# Patient Record
Sex: Male | Born: 2005 | Race: White | Hispanic: No | Marital: Single | Smoking: Never smoker
Health system: Southern US, Community
[De-identification: ages and names within clinical notes are randomized; demographics above are authoritative.]

## PROBLEM LIST (undated history)

## (undated) DIAGNOSIS — T7840XA Allergy, unspecified, initial encounter: Secondary | ICD-10-CM

## (undated) HISTORY — PX: CIRCUMCISION: SUR203

## (undated) HISTORY — DX: Allergy, unspecified, initial encounter: T78.40XA

---
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
## 2024-08-13 NOTE — Progress Notes (Signed)
 "    Shoshanah Dapper T. Jersee Winiarski, MD, CAQ Sports Medicine [facility] at [facility] [address] [place], [address]  Phone: [phone]  FAX: [phone]  NICKOLES GREGORI - 18 y.o. male  MRN [id number]  Date of Birth: Apr 29, 2006  Date: 08/14/2024  PCP: Watt Mirza, MD  Referral: Barbra Cough, DO  Chief Complaint  Patient presents with   Knee Pain    Right   Fall    Running around Christmas time   Subjective:   Nuri A Shill is a 18 y.o. very pleasant male patient with Body mass index is 21.36 kg/m. who presents with the following:  Discussed the use of AI scribe software for clinical note transcription with the patient, who gave verbal consent to proceed.  Patient presents for sports medicine consultation.   History of Present Illness Kailan A Coufal is a 18 year old male who presents with chronic right knee pain exacerbated by recent falls.  He has been experiencing chronic right knee pain for the past couple of years, which has intensified following two falls on hard surfaces around Christmas time. 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  There is no significant swelling or discoloration noted following the falls, although there may have been some initial swelling. He manages the pain by stretching and applying pressure to the knee, though the effectiveness of these measures is uncertain. He exercises once or twice a week, focusing on upper body workouts, and does not regularly engage in leg exercises. He denies any history of fractures or surgeries in the knee.  During the review of symptoms, he notes that the pain is not constant and that it hurts when pressure is applied to a specific area on the inside of the knee.    Review of Systems is noted in the HPI, as appropriate  Objective:    BP 90/60   Pulse 69   Temp 97.9 F (36.6 C) (Temporal)   Ht 5' 5 (1.651 m)   Wt 128 lb 6 oz (58.2 kg)   SpO2 98%   BMI 21.36 kg/m   GEN: No acute distress; alert,appropriate. PULM: Breathing comfortably in no respiratory distress PSYCH: Normally interactive.    Knee: Right Gait: Normal heel toe pattern ROM: 0-1 35 Effusion: neg Echymosis or edema: none Patellar tendon NT Painful PLICA: Mild plica medial and laterally, mildly tender to palpation Patellar grind: negative Medial and lateral patellar facet loading: Mildly tender medial and lateral joint lines:NT Mcmurray's neg Flexion-pinch neg Varus and valgus stress: stable Lachman: neg Ant and Post drawer: neg Hip abduction, IR, ER: WNL Hip flexion str: 5/5 Hip abd: 5/5 Quad: 5/5 VMO atrophy:No Hamstring concentric and eccentric: 5/5   Physical Exam MUSCULOSKELETAL: Right knee with normal extension and range of motion, no swelling. Ligaments stable, including ACL. Non-tender on palpation.  Laboratory and Imaging Data:  Assessment and Plan:     ICD-10-CM   1. 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 Chronic pain of right knee  M25.561 DG Knee 4 Views W/Patella Right   G89.29     3. Inattention  R41.840 Ambulatory referral to Psychology    4. Patellofemoral syndrome, right  M22.2X1      Radiographically, on the sunrise view the patient does have a lateral tilt of the patella but  there is no evidence of fracture and no significant degenerative changes. 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 X-ray confirmed maltracking with no major bone abnormalities.  - Recommend ibuprofen  or Aleve for pain management. - Advise ice massage to reduce irritation. - Encourage leg exercises, such as squats with dumbbells, to strengthen the knee. - Provide printout of leg exercises.  Possible attention-deficit disorder (ADD/ADHD) Attention difficulties noted,  particularly in school settings. Challenges with task completion and focus reported. Interest in ADD evaluation expressed. - Recommend pursuing an ADD evaluation for further assessment.    Orders placed today for conditions managed today: Orders Placed This Encounter  Procedures   DG Knee 4 Views W/Patella Right   Ambulatory referral to Psychology    Disposition: No follow-ups on file.  Dragon [employer] One speech-to-text software was used for transcription in this dictation.  Possible transcriptional errors can occur using Animal nutritionist.   Signed,  Jacques DASEN. Kyliee Ortego, MD   Outpatient Encounter Medications as of 08/14/2024  Medication Sig   cephALEXin  (KEFLEX ) 500 MG capsule Take 1 capsule (500 mg total) by mouth 3 (three) times daily.   [DISCONTINUED] cetirizine  (ZYRTEC ) 10 MG tablet Take one tablet at night for nasal congestion   [DISCONTINUED] clindamycin -benzoyl peroxide (BENZACLIN) gel DISPENSE GENERIC FOR INSURANCE. APPLY TO ACNE TWICE A DAY AFTER WASHING FACE WITH ACNE SOAP   [DISCONTINUED] diazepam (DIASTAT ACUDIAL) 10 MG GEL Place 7.5 mg rectally.   [DISCONTINUED] ketoconazole  (NIZORAL ) 2 % cream Apply equal amount of cream with triamcinolone  cream twice day for up to one week as needed   [DISCONTINUED] mupirocin  ointment (BACTROBAN ) 2 % Apply 1 application topically 2 (two) times daily.   [DISCONTINUED] polyethylene glycol powder (GLYCOLAX /MIRALAX ) powder Take 17 g by mouth 2 (two) times daily as needed for mild constipation or moderate constipation.   [DISCONTINUED] triamcinolone  cream (KENALOG ) 0.1 % Apply equal amount of cream with ketoconazole  cream twice day for up to one week as needed   No facility-administered encounter medications on file as of 08/14/2024.   "

## 2024-08-14 ENCOUNTER — Ambulatory Visit (INDEPENDENT_AMBULATORY_CARE_PROVIDER_SITE_OTHER): Admitting: Family Medicine

## 2024-08-14 ENCOUNTER — Ambulatory Visit (INDEPENDENT_AMBULATORY_CARE_PROVIDER_SITE_OTHER)
Admission: RE | Admit: 2024-08-14 | Discharge: 2024-08-14 | Disposition: A | Discharge status: Home / Self Care | Source: Ambulatory Visit | Attending: Family Medicine | Admitting: Family Medicine

## 2024-08-14 VITALS — BP 90/60 | HR 69 | Temp 97.9°F | Ht 65.0 in | Wt 128.4 lb

## 2024-08-14 DIAGNOSIS — M6751 Plica syndrome, right knee: Secondary | ICD-10-CM

## 2024-08-14 DIAGNOSIS — M222X1 Patellofemoral disorders, right knee: Secondary | ICD-10-CM | POA: Diagnosis not present

## 2024-08-14 DIAGNOSIS — R41840 Attention and concentration deficit: Secondary | ICD-10-CM

## 2024-08-14 DIAGNOSIS — G8929 Other chronic pain: Secondary | ICD-10-CM | POA: Diagnosis not present

## 2024-08-14 DIAGNOSIS — M25561 Pain in right knee: Secondary | ICD-10-CM

## 2024-08-23 ENCOUNTER — Ambulatory Visit: Payer: Self-pay | Admitting: Family Medicine

## 2024-08-30 ENCOUNTER — Encounter: Payer: Self-pay | Admitting: *Deleted

## 2024-10-07 ENCOUNTER — Ambulatory Visit: Admitting: Psychologist

## 2024-10-07 DIAGNOSIS — F909 Attention-deficit hyperactivity disorder, unspecified type: Secondary | ICD-10-CM

## 2024-10-07 NOTE — Progress Notes (Unsigned)
 Psychology Visit via Telemedicine  10/07/2024 Roy Simmons [id number]  Session Start time: 10:00  Session End time: 11:00 Total time: 60 minutes on this telehealth visit inclusive of face-to-face video and care coordination time.  Referring Provider: PCP, Dr. Ubaldo Type of Visit: Video Patient location: Home Provider location: Practice Office All persons participating in visit: Roy Simmons  Confirmed patient's address: Yes  Confirmed patient's phone number: Yes  Any changes to demographics: No   Confirmed patient's insurance: Yes  Any changes to patient's insurance: No   Discussed confidentiality: Yes    The following statements were read to the patient.  The purpose of this telehealth visit is to provide psychological services remotely and you understand the limitations of a virtual visit rather than an in person visit. If technology fails and video visit is discontinued, you will receive a phone call on the phone number confirmed in the chart above. Do you have any other options for contact No   By engaging in this telehealth visit, you consent to the provision of healthcare.  Additionally, you authorize for your insurance to be billed for the services provided during this telehealth visit.   Patient consented to telehealth visit: Yes    Roy Simmons was seen in consultation by request of PCP for evaluation and management of ADHD.     Roy Simmons likes to be called Roy Simmons.   Gender assigned at birth: male Gender identity: male Preferred pronouns: he/him  Provider/Observer:  Roy Simmons. Roy Simmons, Roy Simmons  Reason for Service:  Testing for ADHD  Consent/Confidentiality discussed with patient:Yes Clarified the medical team at [facility], including BH coordinators and other staff members at [facility] involved in their care will have access to their visit note information unless it is marked as specifically sensitive: Yes  Reviewed with patient what will be discussed with parent/caregiver/guardian &  patient gave permission to share that information: Yes Reviewed with patient what information is able to be seen in EMR (Epic) and by who: Yes   Behavioral Observation: Roy Simmons  presents as a 18 y.o.-year-old Male who appeared his stated age. his manners were Appropriate to the situation.  There were not any physical disabilities noted.  he displayed an appropriate level of cooperation and motivation.    Mental status exam        Orientation: oriented to time, place and person, appropriate for age        Speech/language:  speech development normal for age, level of language normal for age        Attention:  attention span and concentration appropriate for age        Naming/repeating:  names objects, follows commands, conveys thoughts and feelings  Sources of information include previous medical records, school records, and direct interview with patient and/or parent/caregiver during today's appointment with this provider.   Notes on Problem: Reports difficulty focusing on tasks and paying attention, even during conversation. Feels he's noticed this only 1-2 years ago. Can get behind on things - out of sight/out of mind (I.e. changing oil). Feels he's never thought about it before, thought it was his normal. His mother started talking to him about it and saying that she has ADHD and others in the family so he started thinking about it. Feels he's anxious b/c he's always in his Roy Simmons thinking about what he's going to say or do next or check off a list. Feels some concern with mood b/c he feels overstimulated - feels drained, doesn't feel good. Checked of dissociation  b/c he checks out when overwhelmed. Feels this occurs every couple of days. Has noticed anxiety since a young child and the mood more recently.   Strategies Attempted at home Has dealt with in on his own for anxiety. Has spoken to other doctors about mood symptoms in the past. Has never taken medication or therapy for these  conditions.   Current Language Ability/Level: fluent  Any functional impairments in adaptive behaviors?  No  Trauma History No  Risk Assessment: Danger to Self:  Yes - some passive S/I once every couple of months and just pushes the thought away - feels its not an option, would never do this. Feels this makes him feel more emotional. When feeling overwhelmed for too long, these thoughts come on. Has thought about therapy but has not initiated in it - not sure about therapy.  Self-injurious Behavior: No Danger to Others: No Duty to Warn:no Physical Aggression / Violence:No  Access to Firearms a concern: Yes  - Locked away Gang Involvement:No  Patient / guardian was educated about steps to take if suicide or homicide risk level increases between visits: yes While future psychiatric events cannot be accurately predicted, the patient does not currently require acute inpatient psychiatric care and does not currently meet [place]  involuntary commitment criteria.  Medical History: Roy Simmons was the product of an uncomplicated pregnancy, labor, and delivery based on his knowledge. 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 There have been some joint problems and an ankle sprain or fracture. No other medically related events reported including hospitalizations, chronic medical conditions, seizures, Roy Simmons injury, or loss of consciousness. There is no history of cardiac concerns, stomach aches or vocal/motor tics. Had frequent headaches previously. No concerns with hearing or vision. Current medications include none. . Routine medical care is provided by Roy Mirza, MD.   Family History: Roy Simmons with his mother, step father, and two cousins (32 and young adult). Paternal half- sister (60 y/o) Simmons with father. Family relationships are good. Mother is the primary caregiver and is a Roy Simmons. Family  history is positive for ADHD (mother and cousins) and depression (paternal aunt). Father wasn't consistently in his life and when he's around he can be very anxious and awkward, described as in his own world. Biological parents were never married and father was absent since he was a younger child with inconsistent contact every week or two when he had time. Horris grew up living mom and older brother.   Social/Developmental History Robley tends to go to bed late (around 2-5 am - often gaming, on phone, or watching TV) and sleeps in late when he can. Sleeps through the night and gets up around 11am - 1pm. Has trouble falling asleep - mind races at bedtime. In highschool he would go to bed really late - often getting 6 hours of sleep during the week. Feels he's always had trouble falling asleep - never wanting to sleep. Maurio feels he eats pretty well but eats more later into the night. Used to exercise more regularly (weight lifting) but hasn't done this in a while but wants to get back to it.   Salah works part time at [employer] and its going well. Graduated [employer] - graduated 2024. School could be hard b/c he would procrastinate so much. Mother assisted with helping him stay on track. Grades were As and Bs mostly - passed all classes. Got  in trouble a lot for talking and not paying attention in elementary and middle school.   Has a girlfriend that he spends a lot of time with and goes places with. Likes to spend time with mom and used to spend more time with dad's side of the family. Spends time with dog and plays video games. Currently attending [employer] - wants to be a a designer, jewellery. Not enrolled in nursing program yet - completing pre-requisite courses now. Feels he has more motivation now b/c the classes cost money but it can still be a struggle - missed a test recently. Only taking one class now but planning on taking 3 next semester. Has always  done well with reading and needs to apply self more for writing papers. Math has been more of a challenge in the past - had trouble retaining information. Would forget formulas often.     Disposition/Plan:   - Psychological testing for ADHD, anxiety, mood - Alyus is in agreement for mother to complete ratings as needed - Feedback Scheduled - Testing plan discussed with parent who expressed understanding.   Impression/Diagnosis:     Unspecified ADHD   Roy Simmons. Rashena Dowling, SSP, [facility] Licensed Psychological Associate [phone] Psychologist [facility] at [facility]   [phone]  Office [phone]  Fax

## 2024-10-14 ENCOUNTER — Ambulatory Visit: Admitting: Psychologist

## 2024-10-14 DIAGNOSIS — F909 Attention-deficit hyperactivity disorder, unspecified type: Secondary | ICD-10-CM | POA: Diagnosis not present

## 2024-10-14 NOTE — Progress Notes (Addendum)
  Roy Simmons  [id number]  Medicaid Identification Number [id number]   10/14/24  Psychological testing Face to face time start: 9:00  End:11:47  Any medications taken as prescribed for today's visit N/A Any atypicalities with sleep last night no Any recent unusual occurrences no  Purpose of Psychological testing is to help finalize unspecified diagnosis  Today's appointment is one of a series of appointments for psychological testing. Results of psychological testing will be documented as part of the note on the final appointment of the series (results review).  Patient was accompanied today by self  Tests completed during previous appointments: Intake  Individual tests administered: SB-V CNS Vital Signs Adult ADHD Self-Report Scale (ASRS-v1.1)    Depression, Anxiety and Stress Scale (DASS)  Generalized Anxiety Disorder 7-item Scale (GAD-7)    This date included time spent performing: performing the authorized Psychological Testing = 3 hours scoring the Psychological Testing by psychologist= 1 hour  Pre-authorized  [facility] Medicaid approval:   The 8hrs are to be applied as follows but not to exceed a total of units/ 8hrs: o 96130/96132-1 unit, 96131/96133-3 units (not to exceed a total of 4 units) o 96136-1 unit and/or 96138-1 unit (not to exceed 2 units) o 96137-1-7 units and/or 03860- 1-7 units (not to exceed 7 units)  Amount of time to be billed on this date of service for psychological testing: 96130 (0 units) - 1 remaining 96131 (0 units) - 3 remaining 96136 (1 units) - 0 remaining 96137 (7 units) - 0 remaining  Previously Utilized: None  Total amount of time billed for psychological testing: 96130 (0 units) - 1 remaining 96131 (0 units) - 3 remaining 96136 (1 units) - 0 remaining 96137 (7 units) - 0 remaining  Plan/Assessments Needed: Semi Structured Clinical Interview (CNS) DSM-V Cross cutting measure  Interview Follow-up: - Psychological  testing for ADHD, anxiety, mood - Roy Simmons is in agreement for mother to complete ratings as needed - Feedback Scheduled - Testing plan discussed with parent who expressed understanding.  - Kamil left with ADHD Adult Rating Scale - Parent/Caregiver for mother to complete - BREIF-2A emailed 10/14/24 - ROI for PCP signed, hard copy in file and on S drive  Next Appointment:  In Person  Diagnosis: Unspecified ADHD  Impression: Unclear. Ratings and Interview will be crucial. Well focused today and hard worker. Started to struggle around the 2 hour mark, even after a break. Completely missed one WM item b/c he reported to completely zone out.  Heron RAMAN. Yexalen Deike, SSP [place] Licensed Psychological Associate [phone] Psychologist [facility] at [facility]   [phone]  Office [phone]  Fax

## 2024-10-17 ENCOUNTER — Ambulatory Visit: Admitting: Psychologist

## 2024-10-17 ENCOUNTER — Telehealth: Payer: Self-pay

## 2024-10-17 DIAGNOSIS — F909 Attention-deficit hyperactivity disorder, unspecified type: Secondary | ICD-10-CM

## 2024-10-17 NOTE — Telephone Encounter (Signed)
 Yes, I can prescribe ADHD medication.  I would like to get a report from the counselor before I start medication.

## 2024-10-17 NOTE — Progress Notes (Unsigned)
 Roy Simmons  [id number]  Medicaid Identification Number  [id number]     10/17/24  Developmental testing*** Psych Interview Face to face time start: 9:00  End:12:00 Total time: 180 minutes on this telehealth visit inclusive of face-to-face video and care coordination time.  Purpose of Developmental testing is to help finalize unspecified diagnosis  Individual tests administered: Semi-structured Clinical Interview based on BREIF-2  Depression: The DSM-5 outlines the following criterion to make a diagnosis of depression. The individual must be experiencing five or more symptoms during the same 2-week period and at least one of the symptoms should be either (1) depressed mood or (2) loss of interest or pleasure.   No - Depressed mood most of the day, nearly every day. - Base mood is mellow/happy - feels he's here enjoying life.    No - Markedly diminished interest or pleasure in all, or almost all, activities most of the day, nearly every day.  Loses interest in certain games (Fortnite, [place], [place]) after a few days and gets bored with it but continues to play some. This seems to occur if he can't get really good at it and if he isn't he gets bored of it. This has occurred with skateboarding, basketball with friends, working out, and cooking. Still enjoys doing them at times but starts to feel like its pointless but this can be cyclical. However, gets in moments where he feels everything is just meaningless - occurs randomly maybe 2-3 times a month for the day. He then redirects to things he has to do like schoolwork and it gets him going. Lately has enjoyed a new [place] place with girlfriend, watching a new anime, spending time with girlfriend when they are together (watch shows, go out places), talking with mom. Looks forward to smaller moments like getting off work and relaxing/playing video games, finishing a task to get it off his list, eating, and watching shows. Played  basketball with friends for hours a couple weeks ago. Friends he's had since elementary school.   Lately feels overwhelmed like he has so much to do, has a list (exams, air in tires, register for classes, haircut, schoolwork due, appointments, errands). This sense of overwhelm has gotten worse as he's trying to stay on top of it now where before he would procrastinate or just not get things done. Mostly schoolwork has always been okay/passing although sometimes would not turn things in or just decide not to do it. Chores wouldn't get done, would not get things taken care of with his car.  Loves anime and never gets bored of it.    No - Significant weight loss when not dieting or weight gain, or decrease or increase in appetite nearly every day.    No - Psychomotor agitation or retardation nearly every day (observable by others, not merely subjective feelings of restlessness or being slowed down).   Yes - Fatigue or loss of energy nearly every day. - Feels exhausted easily with regular tasks like doing a few dishes. Thinks about consequences to get through work. This is with mundane/boring tasks.    No - Feelings of worthlessness or excessive or inappropriate guilt nearly every day. Feels like he can be a little too much (the way he acts and the things he does) and feels bad for his girlfriend.   No - Diminished ability to think or concentrate, or indecisiveness, nearly every day. Feels a little less focused since graduating high school with more things he has to do and  balancee.    No - Recurrent thoughts of death, recurrent suicidal ideation without a specific plan, or a suicide attempt or a specific plan for committing suicide.  Some passive S/I once every couple of months and just pushes the thought away - feels its not an option, would never do this. Feels this makes him feel more emotional. When feeling overwhelmed for too long, these thoughts come on. Has thought about therapy but has not  initiated in it - not sure about therapy.   Anxiety Feels more irritable more often recently - mood more unstable. Gets annoyed quickly with things his girlfriend may do that before he would find funny or quickly irritated when she makes a mistake like leaving a wrapper out, or if mom isn't actually listening to him. Then is irritable after that.   Feels anxious about things he has to do. Gets nervous about how others will react to him, nervous about being embarrassed.   Social anxiety: According to the DSM-5, (Diagnostic and Statistical Manual of Mental Disorders, fifth edition), there are a total of ten diagnostic criteria for Social Anxiety disorder :  Yes - fear or anxiety specific to social settings, in which a person feels noticed, observed, or scrutinized. In a adult, this could include a first date, a job interview, meeting someone for the first time, delivering an oral presentation, or speaking in a class or meeting. In children, the phobic/avoidant behaviors must occur in settings with peers, rather than adult interactions, and will be expressed in terms of age appropriate distress, such as cringing, crying, or otherwise displaying obvious fear or discomfort. No - typically the individual will fear that they will display their anxiety and experience social rejection, - has physical symtpoms of sweating or shaking leg or feeling cold/shaky Yes - social interaction will consistently provoke distress, people and friends - doesn't always want to hang out b/c doesn't know what's going to happen Yes - social interactions are either avoided, or painfully and reluctantly endured, SUDS 5 somewhat - the fear and anxiety will be grossly disproportionate to the actual situation, yes - the fear, anxiety or other distress around social situations will persist for six months or longer and No - cause personal distress and impairment of functioning in one or more domains, such as interpersonal or  occupational functioning, - Doesn't get in the way a lot, still does what he needs/wants to do  - the fear or anxiety cannot be attributed to a medical disorder, substance use, or adverse medication effects or another mental disorder, and  - if another medical condition is present which may cause the individual to be excessively self conscious- e.g., prominent facial scar, the fear and anxiety are either unrelated, or disproportionate. The clinician may also include the specifier that the social anxiety is performance situation specific - e.g., oral presentations (American Psychiatric Association, 2013).    People with ADHD show a persistent pattern of inattention and/or hyperactivity-impulsivity that interferes with functioning or development:  CNS Observation report: felt the need to get up and move (stretched, got up and blew nose, or rocked in seat - needed to do this to reset brain release the tension - difficulty focusing and discomfort with sitting still no stress or anxiety). Felt lost attention during CPT and color task with the easier items especially. Was able to focus better as it got a little more complex.   Inattention: Six or more symptoms of inattention for children up to age 83 years, or five  or more for adolescents age 46 years and older and adults; symptoms of inattention have been present for at least 6 months, and they are inappropriate for developmental level:  In conversation today, lost his train of thought and asked questions to be repeated several times. Feels a disconnect between his thoughts and what he's saying. Thoughts bounce to different things he's thought/said and then loses track.   No - Often fails to give close attention to details or makes careless mistakes in schoolwork, at work, or with other activities. yes - Often has trouble holding attention on tasks or play activities. - depends on how interested he is yes - Often does not seem to listen when spoken to  directly. yes - Often does not follow through on instructions and fails to finish schoolwork, chores, or duties in the workplace (e.g., loses focus, side-tracked). Yes - Often has trouble organizing tasks and activities. Throws a bunch of stuff into drawers but doesn't have things organized necessarily but usually knows where things are even though its a mess.  yes - Often avoids, dislikes, or is reluctant to do tasks that require mental effort over a long period of time (such as schoolwork or homework). Is good about finishing when already started but has a hard time getting started on a project. Does hard/interesting parts first and then puts off the easier parts.  yes - Often loses things necessary for tasks and activities (e.g. school materials, pencils, books, tools, wallets, keys, paperwork, eyeglasses, mobile telephones).Misplaces things often but has improved b/c doesn't want to have to pay for it.  yes - Is often easily distracted Yes - Is often forgetful in daily activities. - tends to forget appointments if there isn't a visual reminder. Does not have a calendar. Only has a to do to list.   Hyperactivity and Impulsivity: Six or more symptoms of hyperactivity-impulsivity for children up to age 6 years, or five or more for adolescents age 49 years and older and adults; symptoms of hyperactivity-impulsivity have been present for at least 6 months to an extent that is disruptive and inappropriate for the person's developmental level: yes - Often fidgets with or taps hands or feet, or squirms in seat. no - Often leaves seat in situations when remaining seated is expected. yes - Often runs about or climbs in situations where it is not appropriate (adolescents or adults may be limited to feeling restless). no - Often unable to play or take part in leisure activities quietly. yes - Is often "on the go" acting as if "driven by a motor". - can be still for hours with video games but otherwise likes to  be on the go b/c doesn't like to be bored yes - Often talks excessively. yes - Often blurts out an answer before a question has been completed. no - Often has trouble waiting their turn. yes - Often interrupts or intrudes on others (e.g., butts into conversations or games)   These symptoms make him feel like he has tension in his brain - overwhelm. Takes a lot to organize his thoughts. Hard for him to think about what he wants/needs to focus on leading to a feeling of always being on-edge and a feeling like he's going to explode. It interferes at school with harder projects, gets overwhelmed and takes longer to get things done. Lots of trouble focusing/overwhelmed. Causes problems with girlfriend b/c he can be irritable. Biggest issue for his girlfriend is his difficulty not being able to pay attention to  what she's saying and he may just start scrolling even though he knows he shouldn't do that. Does this with friends as well.   This date included time spent performing: clinical interview = 2 hours performing and scoring Developmental Testing = 1 hour scoring Developmental Testing by psychologist= *** integration of patient data = *** interpretation of standard test results and clinical data = *** clinical decision making = *** Documentation of developmental testing = ***  Total amount of time to be billed on this date of service for developmental testing  03887 (1 unit)  = 1 hour 96113 (*** units) = *** hours  Interview Follow-up: - Psychological testing for ADHD, anxiety, mood - Keval is in agreement for mother to complete ratings as needed - Feedback Scheduled - Testing plan discussed with parent who expressed understanding.  - Brady left with ADHD Adult Rating Scale - Parent/Caregiver for mother to complete. Completed and returned - BREIF-2A emailed 10/14/24. Completed. - ROI for PCP signed, hard copy in file and on S drive  Next Appointment: virtual  Diagnosis: Unspecified  ADHD  Impression: Unclear. Ratings and Interview will be crucial. Well focused today and hard worker. Started to struggle around the 2 hour mark, even after a break. Completely missed one WM item b/c he reported to completely zone out.  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 Elizabth Palka, SSP [place] Licensed Psychological Associate [phone] Psychologist [facility] at [place]   [phone]  Office [phone]  Fax

## 2024-10-17 NOTE — Telephone Encounter (Signed)
 Copied from CRM [id number]. Topic: Clinical - Medication Question >> Oct 17, 2024 12:26 PM Deaijah H wrote: Reason for CRM: Patient would like to leave a message for Dr. Watt regarding getting prescribed medication. Would like to know if he would be comfortable or would prescribed ADHD medicine. His psychiatrist would like to know if Dr. Watt would need a full report or summary if he does prescribe.    Dr. Watt please advise.

## 2024-12-15 ENCOUNTER — Encounter: Payer: Self-pay | Admitting: Family Medicine

## 2024-12-29 NOTE — Progress Notes (Unsigned)
" ° ° ° °  Roy Simmons T. Roy Mee, MD, CAQ Sports Medicine [facility] at [facility] [address] [place], [address]  Phone: [phone]  FAX: [phone]  Roy Simmons - 19 y.o. male  MRN [id number]  Date of Birth: 08-May-2006  Date: 12/30/2024  PCP: Watt Mirza, MD  Referral: Watt Mirza, MD  No chief complaint on file.  Subjective:   Roy Simmons is a 19 y.o. very pleasant male patient with There is no height or weight on file to calculate BMI. who presents with the following:  Discussed the use of AI scribe software for clinical note transcription with the patient, who gave verbal consent to proceed.  Patient presents for ADHD evaluation and discussion of medication.   History of Present Illness     Review of Systems is noted in the HPI, as appropriate  Objective:   There were no vitals taken for this visit.  GEN: No acute distress; alert,appropriate. PULM: Breathing comfortably in no respiratory distress PSYCH: Normally interactive.   Laboratory and Imaging Data:  Assessment and Plan:   No diagnosis found. Assessment & Plan   Medication Management during today's office visit: No orders of the defined types were placed in this encounter.  There are no discontinued medications.  Orders placed today for conditions managed today: No orders of the defined types were placed in this encounter.   Disposition: No follow-ups on file.  [employer] One speech-to-text software was used for transcription in this dictation.  Possible transcriptional errors can occur using Animal nutritionist.   Signed,  Mirza DASEN. Aralynn Brake, MD   Outpatient Encounter Medications as of 12/30/2024  Medication Sig   cephALEXin  (KEFLEX ) 500 MG capsule Take 1 capsule (500 mg total) by mouth 3 (three) times daily.   No facility-administered encounter medications on file as of 12/30/2024.   "

## 2024-12-30 ENCOUNTER — Ambulatory Visit (INDEPENDENT_AMBULATORY_CARE_PROVIDER_SITE_OTHER): Admitting: Family Medicine

## 2024-12-30 VITALS — BP 100/60 | HR 73 | Temp 97.1°F | Ht 65.0 in | Wt 135.1 lb

## 2024-12-30 DIAGNOSIS — F902 Attention-deficit hyperactivity disorder, combined type: Secondary | ICD-10-CM

## 2024-12-30 DIAGNOSIS — Z0283 Encounter for blood-alcohol and blood-drug test: Secondary | ICD-10-CM

## 2025-01-02 ENCOUNTER — Encounter: Payer: Self-pay | Admitting: Family Medicine

## 2025-01-02 LAB — DRUG MONITORING, PANEL 8 WITH CONFIRMATION, URINE
6 Acetylmorphine: NEGATIVE ng/mL
Alcohol Metabolites: NEGATIVE ng/mL
Amphetamines: NEGATIVE ng/mL
Benzodiazepines: NEGATIVE ng/mL
Buprenorphine, Urine: NEGATIVE ng/mL
Cocaine Metabolite: NEGATIVE ng/mL
Creatinine: 192 mg/dL
MDMA: NEGATIVE ng/mL
Marijuana Metabolite: 65 ng/mL — ABNORMAL HIGH
Marijuana Metabolite: POSITIVE ng/mL — AB
Opiates: NEGATIVE ng/mL
Oxidant: NEGATIVE ug/mL
Oxycodone: NEGATIVE ng/mL
pH: 7.7 (ref 4.5–9.0)

## 2025-01-02 LAB — DM TEMPLATE

## 2025-01-02 MED ORDER — METHYLPHENIDATE HCL ER (CD) 10 MG PO CPCR: 10.0000 mg | ORAL_CAPSULE | ORAL | 0 refills | Status: AC

## 2025-01-09 ENCOUNTER — Ambulatory Visit: Admitting: Psychologist

## 2025-01-09 DIAGNOSIS — F902 Attention-deficit hyperactivity disorder, combined type: Secondary | ICD-10-CM

## 2025-01-09 DIAGNOSIS — F418 Other specified anxiety disorders: Secondary | ICD-10-CM | POA: Diagnosis not present

## 2025-01-09 NOTE — Progress Notes (Signed)
 Psychology Visit via Telemedicine  01/09/2025 Roy Simmons [id number]  Session Start time: 1:15  Session End time: 1:50 Total time: 35 minutes on this telehealth visit inclusive of face-to-face video and care coordination time.  Referring Provider: PCP Type of Visit: Video Patient location: Home Provider location: Practice Office All persons participating in visit: Roy Simmons  Confirmed patient's address: Yes  Confirmed patient's phone number: Yes  Any changes to demographics: No   Confirmed patient's insurance: Yes  Any changes to patient's insurance: No   Discussed confidentiality: Yes    The following statements were read to the patient and/or legal guardian.  The purpose of this telehealth visit is to provide psychological services remotely and you understand the limitations of a virtual visit rather than an in person visit. If technology fails and video visit is discontinued, you will receive a phone call on the phone number confirmed in the chart above. Do you have any other options for contact No   By engaging in this telehealth visit, you consent to the provision of healthcare.  Additionally, you authorize for your insurance to be billed for the services provided during this telehealth visit.   Patient and/or legal guardian consented to telehealth visit: Yes   Psychological testing Purpose of Psychological testing is to help finalize unspecified diagnosis  Today's appointment is the final appointment of the series (results review).  Tests completed during previous appointments: Intake SB-V CNS Vital Signs Adult ADHD Self-Report Scale (ASRS-v1.1)    Depression, Anxiety and Stress Scale (DASS)  Generalized Anxiety Disorder 7-item Scale (GAD-7)   Individual tests administered: BREIF-2 self report ADHD Adult Rating Scale Parent/Other  This date included time spent performing: scoring the Psychological Testing by psychologist= 30 mins integration of patient  data = 15 mins interpretation of standard test results and clinical data = 30 mins clinical decision making = 15 mins treatment planning and report = 3 hours interactive feedback to the patient, family member/caregiver =1 hour  Pre-authorized  [facility] approval:   The 8hrs are to be applied as follows but not to exceed a total of units/ 8hrs: o 96130/96132-1 unit, 96131/96133-3 units (not to exceed a total of 4 units) o 96136-1 unit and/or 96138-1 unit (not to exceed 2 units) o 96137-1-7 units and/or 96139- 1-7 units (not to exceed 7 units)  Amount of time to be billed on this date of service for psychological testing: 96130 (1 units) - 0 remaining 96131 (3 units) - 0 remaining  Previously Utilized: 96130 (0 units) - 1 remaining 96131 (0 units) - 3 remaining 96136 (1 units) - 0 remaining 96137 (7 units) - 0 remaining  Total amount of time billed for psychological testing: 96130 (1 units) - 0 remaining 96131 (3 units) - 0 remaining 96136 (1 units) - 0 remaining 96137 (7 units) - 0 remaining  Plan/Assessments Needed: Report previously sent to patient via secure email  Interview Follow-up: PRN Office Phone: [phone] Office Fax: [phone] [email or web address]  PSYCHOLOGICAL EVALUATION REPORT - CONFIDENTIAL                   PATIENT'S IDENTIFYING INFORMATION  Name: Roy Simmons MRN: [id number]   DOB: [birth year] Examiner: Heron Sprague, SSP, [place]  Chronological Age: 19 Evaluation:  10/27, 11/3 & 10/17/2024  Gender/Identity: Male/Male Report: 12/01/24   REASON FOR REFERAL Roy Simmons was referred by Dr. Ubaldo with [facility] for psychological testing with concern for Attention Deficit Hyperactivity Disorder (ADHD). The purpose of the evaluation is to  provide diagnostic information and treatment recommendations.    ASSESSMENT PROCEDURES Adult ADHD Self-Report Scale (ASRS-v1.1) Symptom Checklist LF-18  ADHD Adult Rating Scale Parent/Other  Behavior  Rating Inventory of Executive Function, Second Edition - Adult Version (BRIEF 2A) Self-Report  CNS Vital Signs  Depression, Anxiety and Stress Scale (DASS) LF-42  Semi-Structured Clinical Interview  The Stanford Binet - Fifth Edition (SB-5)   BACKGROUND INFORMATION Sources of information include previous medical records and direct interview with patient during appointments with this provider. Medical History: Roy Simmons was the product of an uncomplicated pregnancy, labor, and delivery based on his knowledge. Medical history includes seizure-like activity around 19 y/o and severe and chronic headaches with Chiari Malformation discovered upon MRI. Pediatric neurologist Roy Snuffer, MD, indicated that no intervention for Chiari Malformation was needed, and parents were to monitor for any development of concerning symptoms. Roy Simmons has had some joint problems and an ankle sprain or fracture. No other medically related events reported including hospitalizations, chronic medical conditions, Roy Simmons injury, or loss of consciousness. There is no history of cardiac concerns, stomach aches or vocal/motor tics. No concerns with hearing or vision. No current medications, daily medications taken or therapies provided. Routine medical care is provided by Roy Schroeder, MD.  Family History: Roy Simmons lives with his mother, stepfather, and two cousins (49 and young adult). Roy Simmons's half- sister (47 y/o) lives with her father. Family relationships are good. Mother is the primary caregiver and is a paramedic. Family history is positive for ADHD (mother and cousins) and depression (paternal aunt). Father wasn't consistently in Roy Simmons's life and when he is, he can be very anxious and awkward, described as in his own world. Biological parents were never married, and father was absent since Edman was a younger child with inconsistent contact every week or two when he had time. Argus grew up living with mom and older brother.   Social/Developmental History: Roy Simmons tends to go to bed late (around 2-5 am - often gaming, using his phone, or watching TV) and sleeps in late when he can. He sleeps through the night and gets up around 11am - 1pm. Roy Simmons has trouble falling asleep - mind races at bedtime. In high school he would go to bed very late - often only getting 6 hours of sleep during the week. Roy Simmons feels he's always had trouble falling asleep - never wanting to sleep. Roy Simmons has never needed therapy to address any underlying emotional concerns but was seen by an integrated behavioral health clinician in his early teen years during COVID due to difficulty sleeping. Roy Simmons feels he eats well but eats more later into the night. He exercised more regularly (weightlifting) in the past but hasn't done this in a while, although he wants to get back to it.  Labaron works part time at [employer] and it's going well. He graduated from [employer] in 2024. He described school as being challenging at times because he would procrastinate so much. Mother assisted with helping him stay on track with getting things done. His grades were As and Bs mostly - passed all classes. Math has been more of a challenge in the past - had trouble retaining information and would forget formulas often. Elden was often in trouble for talking and not paying attention in elementary and middle school.  Waylyn has a girlfriend that he spends a lot of time with and goes places with. He also likes to spend time with his mom, dog, and play video games. Camryn used  to spend more time with his dad's side of the family. Yahsir is currently attending [employer]) with a goal of becoming a designer, jewellery. He is not enrolled in a nursing program yet - completing pre-requisite courses now. Klaus feels he has more motivation with school now because the classes cost money, but it can still be a struggle to stay on top of assignments - he  missed a test recently. Rochell is only taking one class currently but is planning on taking 3 classes next semester. He has always done well with reading and feels he needs to apply himself more when writing papers.   OBSERVATIONS Roy Simmons was seen in-person for evaluation with virtual visits utilized for intake. During direct testing appointments, Roy Simmons easily engaged in conversation with this examiner. He cooperated throughout and completed all items with a high effort level. However, Roy Simmons presented with waning motivation to maintain effort level as testing progressed. He completely missed one item during direct testing because he reported to zone out. During computerized neurocognitive testing when completing the CNS Vital Signs, Seiji reported that he lost attention with simple tasks and was able to focus better when tasks were more engaging/complex. He felt the need to get up and move or stretch during this 45-minute computerized assessment due to difficulty focusing and sitting still. In conversation with this examiner, Roy Simmons tended to lose his train of thought and asked questions to be repeated several times. He described that he feels a disconnect between his thoughts and what he's saying. Trejan describes that his thoughts bounce to different things and then he loses track of what he is saying. Results are likely an accurate representation of abilities as presented on a daily basis.  DISCUSSION OF EVALUATION RESULTS Intellectual Abilities: The Stanford Binet - Fifth Edition (SB-5) is the latest revision of the Stanford Binet Scale for assessing the intelligence of individuals ages 2 years through 85+ years. The SB-5 provides a global measure of intellectual ability (Full Scale, FSIQ) as well as aggregated Verbal (VIQ) and Nonverbal (NVIQ) scores. Additionally, the SB-5 provides five Factor Indices to examine specific cognitive strengths and weaknesses.   The Full-Scale IQ (FSIQ) is derived from the sum  of all of the tasks on this instrument. It covers both the Verbal (VIQ) and Nonverbal (NVIQ) domains of cognitive ability and taps the five underlying factor index scales of the SB-5. The FSIQ measures more than knowledge acquired from schooling; it also measures the sum of five major facets of intelligence, including reasoning, stored information, memory, visualization, and the ability to solve novel problems. Overall intellectual ability fell within the average range. When compared with his performance on Verbal subtests, performance on Nonverbal subtests was generally commensurate, indicating that the [facility] is a valid estimate of overall intellectual ability.   The VIQ is a composite of all cognitive skills required to reason; solve problems and visualize and recall information presented in words and sentences (spoken and written). This score reflects Jeremih's ability to explain verbal responses clearly, present rationale for response choices, create stories and explain spatial directions. Montel's verbal cognitive skills fell within the average range.   The NVIQ is based on five subtests that measure the skills needed when solving abstract, picture-oriented problems; recalling facts and figures; solving quantitative problems shown in picture form; assembling designs and recalling tapping sequences. This score reflects Attila's ability to reason, problem solve, visualize and recall information presented in pictorial, figural, and symbolic forms. Rowen's nonverbal cognitive abilities fall within  the average range.   The Binet-5 also produces five (5) composite indices that represent specific domains of cognitive functioning.  No normative relative strengths or weaknesses were identified. Fluid Reasoning (FR) is the ability to solve verbal and nonverbal problems using inductive or deductive reasoning. Activities within FR subtests require individuals to determine the underlying rules or relationships among pieces  of information (such as visual objects) that are novel to the individual. Maher's FR falls within the low average range.  Knowledge (KN) is a person's accumulated fund of general information acquired at home, school, or work. This factor is called crystallized ability because it involves learned material that requires information to be stored in long-term memory. Arty's KN falls within the average range.  Quantitative Reasoning (QR) is an individual's facility with numbers and numerical problem solving, whether with word problems or with pictured relationships. Activities in the SB-5 emphasize applied problem solving more than specific mathematical knowledge acquired through formal instruction. Barrington's overall QR falls within the average range.   Visual-Spatial Processing (VS) Measures an individual's ability to see patterns, relationships, spatial orientations, or gestalt whole among diverse pieces of visual display. Tu's VS abilities fall within the average range.   Finally, the Working Memory Index (WM) examines Lesly's ability to store diverse information in short-term memory and then sort or transform that information. The primary nonverbal and verbal abilities measured included: impulse control, patience with difficult tasks, retention span, visual sequence tracking and wide auditory attention span. Candace's performance fell within the above average range overall.   Emotional/Behavioral Functioning: Concerns with ADHD: CNS Vital Signs results are significant for ADHD with below average to low average scores on the Cognitive Flexibility and Executive Function domains (2 of 5 domains obtained based on subtests administered that are most sensitive for ADHD - Complex Attention, Cognitive Flexibility, Processing Speed, Executive Function, Simple Attention). This is below expectation when compared with overall intelligence as measured by the SB-5. Results also indicate that Kyzer has some weaknesses with  verbal memory, potentially secondary to lapses in focus/attention, and a very low reaction time. This may be related to weaknesses with processing speed or an indicator of cognitive slowing, which is often observed with individuals who have ADHD or depression. Additionally, rating scales across raters are consistently indicative of ADHD. Self-report ratings of the BRIEF 2-A are elevated on the Behavior Regulation Index (BRI - indicative of hyperactive/impulsive tendencies), Cognitive Regulation Index (CRI - indicative of inattentive tendencies), and the Emotion Regulation Index (ERI - indicative of emotional dysregulation associated with things like anxiety or ASD). ASRS Self-Report ratings are considered consistent with ADHD with 5 marks within Part A being endorsed. Mother's ratings on the ADHD Adult Rating Scale Parent/Other indicated symptoms of ADHD being present before the age of 44 and currently. During clinical interview, Antuane indicated that he has difficulty holding attention to tasks, which improves if he's interested in the task. He often does not listen during conversation due to distractibility despite desire to pay attention to what speakers are saying. Ahren procrastinates often with getting started on projects and when he does, he completes the more challenging/interesting parts and then struggles to complete the less interesting, more mundane portions. He struggles to stay organized, often loses things, and tends to forget appointments. Quinten feels he fidgets often, feels restless, likes to be on the go unless he's playing video games, talks excessively, and blurts out answers before questions are completed or interrupts others in conversation.  Chijioke describes that these symptoms result  in a feeling of tension in his brain that lead to overwhelm. He describes that it takes a lot to organize his thoughts. It is hard for him to think about what he wants/needs to focus on, leading to a feeling of  always being on-edge and like he's going to explode. This interferes at school with harder projects, getting overwhelmed, and taking longer to get things done. This causes problems with his girlfriend because it can cause him to be irritable. The biggest issue for his girlfriend is Demetries's difficulty paying attention to what she's saying. He may just start scrolling on his phone while she's talking even though he knows he shouldn't do that. This happens with his friends as well.  Concerns with mood and anxiety: Results of the Depression, Anxiety and Stress Scale indicate elevated ratings on all scales. However, Ashir denies a depressed mood most days, most of the day, or a loss of interest or pleasure in previously enjoyed activities nearly every day. He describes himself as enjoying life. He does get bored of certain video games or anime after a few days. Avantae tends to get bored with activities in general and seeks something new, which is common when ADHD is present. However, there are moments where he feels everything is just meaningless, which occurs randomly, maybe 2-3 times a month, lasting for the day. He then redirects to things he has to do like schoolwork, and it gets him going. Lately, he has enjoyed a new [employer] with his girlfriend, watching a new anime show, spending time with his girlfriend when they are together (watching shows, go out places), and talking with his mom. Tobin looks forward to smaller moments in life like getting off work and relaxing/playing video games, finishing a task to get it off his list, eating, and watching shows. He recently played basketball with friends for hours. These are friends he's had since elementary school.  Roy Simmons reports no recent and significant changes in weight, slowing of activity level, feelings of worthlessness or inappropriate guilt, nor diminished ability to think/concentrate. Markeith does experience some passive S/I once every couple of  months and just pushes the thought away. He feels that suicide is not an option and that he would never do this. When he feels overwhelmed for too long, these thoughts come on. Adiel has thought about therapy but has not initiated in it. He does report to feel fatigued easily. This appears to be secondary to a sense of overwhelm that is likely related to untreated ADHD symptoms. Lately, he feels overwhelmed with too much to do with things like school exams, making sure he has air in his car tires, registering for classes, needing to get a haircut, turning in schoolwork that is due, keeping appointments, and running errands. He keeps a to do list but feels stressed. This sense of overwhelm has gotten worse as he's trying to stay on top of things more now where before he would procrastinate or just not get things done. Historically, schoolwork has mostly been okay/passing although sometimes he would not turn things in or just decide not to do them. Shloimy generally wouldn't get his chores done and would not get things taken care of with his car.  Although most of Marte's anxiety appears secondary to stress associated with managing life with untreated ADHD, Jencarlos does present with some mild social anxiety symptoms. Clemens reports to be anxious in social situations often, being nervous about how others might react to him or worrying that he  might embarrass himself. This leads to some physical symptoms like sweating, leg bouncing, or a feeling of being cold/shaky and avoidance of social interaction at times. However, this does not interfere in his life or functioning. Mingo reports to engage with others socially to an extent that is joyful and supportive in his life.   Diagnostic Summary  Cordney is a 19 year old man, taking college classes part-time and working part-time at [employer]. Shrihaan has a history of inattentive, hyperactive, and impulsive symptoms that are now impairing his functioning in adulthood. Primary  care provider requested psychological evaluation with an emphasis on assessing for ADHD. The results of this evaluation indicate that Alvia's intellectual ability falls within the average range based on the SB-5 with generally similarly developed skills across cognitive abilities measured. However, working memory skills fall within the above average range. Although Morty performed well during direct assessment with what appeared to be strong ability to maintain focus, this is not unusual with adults with ADHD when engaged in novel and interesting tasks. Further, Adaiah's attention waned as testing progressed and was observed to be significantly interrupted during more mundane tasks, like when engaged in conversation. Further, direct assessment via CNS Vital signs, ratings scales completed across informants, and clinical interview all indicate a profile consistent with ADHD combined presentation. Although there is some indication for depressive symptoms and anxiety, much of this is secondary to stress associated with trying to manage life in adulthood with untreated ADHD. Yedidya does not currently present with depression but does present with some symptoms of social anxiety. Full criteria for ADHD are sometimes not met for individuals who present with symptoms until task demands exceed abilities, which for some individuals may not be until high school, post-secondary school, or during the course of employment.  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 Medication, along with behavioral supports, is the most effective treatment for ADHD, and non-stimulant and stimulant options exist. Getting connected with a therapist to work through underlying  anxiety along with ADHD symptoms will be supportive. A therapist can support learning of cognitive behavioral strategies to address cognitive distortions and help you establish strategies to improve executive functioning deficits associated with ADHD, along with helping you establish better sleep hygiene. Limited sleep and/or poor sleep quality exacerbates ADHD, anxiety, and mood symptoms.  Overall, executive functioning (EF) concerns itself with the regulation of thinking and carrying out the process to achieve a desired outcome/goal. This is often disrupted when ADHD is present. Please see suggested strategies below. Also, see additional handout based on BREIF-2A self-report ratings, for more comprehensive support regarding executive functioning.  Set a timer to help track the passing of time closer and break large tasks into more manageable chunks.  Use calendars, reminders, sticky notes, etc. to assist in reducing forgetfulness.  Set needed items out or pack them in the car the day prior to when they will be needed. Create reasonable to-do lists and store them where easily accessible and make extra copies (if physical) in case they are misplaced.  Find an organization system that improves organization but is maintainable (e.g., a box where items not relevant to the current task go, color coded file system, or a psychiatrist). Record yourself or have a trusted other person give you cues in situations in which you perceive to struggle to assist  in building self-awareness.  The 4Rs: Read just one paragraph, recite out loud in a soft voice or whisper what was important in that material, write that material down in a notebook, then review what you just wrote.  Work on tasks with others that are generally organized to assist in sustaining attention and being more publicly accountable than doing the work solo.  Proper diet and exercise can improve executive functioning abilities.  Have a trusted other  assist in managing finances, making a monthly budget sheet of all monthly expenses, setup a system that automatically put a percentage of earnings into a retirement and/or savings account, do not go to a store if there is nothing you NEED to buy, if using credit cards - put a For Emergency Use Only sticker on it, and avoid casinos. Resources for Patients with ADHD Applications: RescueTime. Tracks your activities on phone and/or computer to determine how productive you have been, and what distracted you. Free two-week trial. [email or web address] . Uses engineered audio that may reduce distractions and assist with focus. Free 15-day trial. Freedom. Allows you to highlight days and times you want to block yourself from certain sites or apps. Free trial. Merrily.  Allows you to input your bank accounts and creates a visual layout of various information about your financial goals, budget management, alerts, etc. May offer a free trial. Boomerang. Gives you the option to schedule times an email is sent as well as to see if others have received or opened your email. 10 messages free per month and a free trial of premium version. IFTTT. Uses channels to create various actions (e.g., if you are mentioned in an email to highlight it in your inbox and if you miss a call to add it to a to-do list). Free and premium versions. Unroll.me. Cleans up your email by unsubscribing from what you do not want to receive while still getting everything you do. Free. Finish. Allows you to divide two-list tasks into short-term, mid-term, and long-term as well as how much time is left for a task. Focus mode hides non-priority tasks. Autosilent. Turns your phone ringer on and off based on specified calendars, geo-fences, timers, etc. $3.99. Freakyalarm. Makes you solve math problems to disable an alarm. $1.99. Wake N Shake. Makes you vigorously shake your phone to stop the alarm. $.99. Todoist. Allows you to add sub-tasks to tasks as  well as includes email and Web plugins to make it work across system. Premium has location-based reminders, calendar sync, productive tracking, etc. Sleep Cycle. Utilizes your phone's motion sensors to pick up on movement while you are asleep. The alarm will wake you as early as 30 minutes before your alarm based on your lightest phase of sleep as well as showing you how daily activities affect your sleep quality. Books Taking Charge of Adult ADHD Second Edition by Dr. Nelwyn Pica The ADHD Effect on Marriage by Eleanor Bowers The Couples Guide to Thriving with ADHD by Eleanor Bowers Stamp Guide for College Students with ADHD and LD by Dr. Rollo Fess The ADHD Guide to Career Success by Dr. Rollo Fess Accommodations in Higher Education Under the Americans with Disabilities Act: A No-Nonsense Guide for Clinicians, Educators, Administrators, and Lawyers by Ozell Burkes and Leonor Prose  If there are any questions or should consultation be desired, please feel free to contact me.  _________________________________ Heron RAMAN. Nefertari Rebman, SSP, HSP-PA [place] Licensed Psychological Associate [phone] Psychologist, [facility]: [facility]    APPENDIX  The Stanford  Binet - Fifth Edition (SB-5) is the latest revision of the Stanford Binet Scale for assessing the intelligence of individuals ages 2 years through 85+ years.  The SB-5 provides a global measure of intellectual ability (Full Scale, FSIQ) as well as aggregated Verbal (VIQ) and Nonverbal (NVIQ) scores.  Additionally, the SB-5 provides five Factor Indices to examine specific cognitive strengths and weaknesses.  The SB-5 provides what are known as standard scores.  The average range with standard scores is from 85 to 115 and the average range with scaled scores (scores provided for subtests) is 8 to 12.   Date Completed: 10/14/24 IQ Scales Standard Score Percentile Range  Full Scale IQ 104 61 Average   Nonverbal IQ 104 61 Average  Verbal IQ 103 58 Average  Factor Index Scores     Fluid Reasoning 88 21 Low Average  Knowledge 97 42 Average  Quantitative Reasoning 108 70 Average  Visual Spatial 103 58 Average  Working Memory 120 91 Above Average  IQ Scales Scaled Score Percentile Range  Nonverbal Scale     Fluid Reasoning 7 16 Below Average  Knowledge 9 37 Average  Quantitative Reasoning 13 84 Above Average  Visual Spatial 10 50 Average  Working Memory 14 91 Above Average  Verbal Scale     Fluid Reasoning 9 37 Average  Knowledge 10 50 Average  Quantitative Reasoning 10 50 Average  Visual Spatial 11 63 Average  Working Memory 13 84 Above Average    Adult ADHD Self-Report Scale (ASRS-v1.1) Symptom Checklist completed by Leanne Moody 10/17/24 Symptoms highly consistent with ADHD in adults: further investigation warranted 5 marks within darkly shaded boxes on Part A  ADHD Adult Rating Scale Parent/Other Completed by mother 10/17/24 Childhood (ages 5 to 50) Inattentive Symptoms : 9 of 9 noted Hyperactive/Impulsive Symptoms : 9 of 9 noted Past 6 months  Inattentive Symptoms : 9 of 9 noted Hyperactive/Impulsive Symptoms : 9 of 9 noted  Depression, Anxiety and Stress Scale (DASS) LF-42 completed by Leanne Moody 10/17/24 Depression = 14 (Moderate) Anxiety = 9 (Mild) Stress = 18 (Mild)     CNS Vital Signs computerized neuropsychological / neurocognitive tests enables a non-invasive, customizable clinical procedure to efficiently and objectively assess a broad-spectrum of brain function domain performances under challenge (cognition stress test) and the millisecond precise measurement of important cognitive functions. The testing platform also contains 60+ well recognized, evidence-based rating instruments to help identify clinical symptoms, behaviors, and comorbidities salient to the evaluation and ongoing management of many neurological, psychiatric and other clinical conditions. Serial  evaluation of neurocognition can help patients and caregivers navigate problems related to daily living, school or vocational work. Completed 10/17/24     Behavior Rating Inventory of Executive Function, Second Edition - Adult Version (BRIEF 2A) (Mean=50, SD=10) The BRIEF2A is a standardized rating scale that allows adults and knowledgeable informants (caregivers, adult children, partners/spouses) to rate executive function or self-regulation in an adult's day-to-day setting. This allows clinicians to gather information from multiple sources, better equipping them to evaluate the complexities of executive function. Nine well-validated clinical scales that measure commonly agreed upon domains of executive functioning are derived: Inhibit, Self-Monitor, Shift, Emotional Control, Initiate, Working Memory, Optician, Dispensing, Dietitian, Printmaker. These clinical scales form three indexes - the Behavior Regulation Index (BRI), the Emotion Regulation Index (ERI), and the Cognitive Regulation Index (CRI) - and an overall summary score, the Global Executive Composite (GEC).  BRIEF2A Self-Report Form Score Summary - completed by Leanne Moody 10/17/2024 Scale/Index/Composite Raw  score T score Percentile 90% CI  Inhibit 15 62 88 54-70  Self-Monitor 12 67 97 59-75  Behavior Regulation Index (BRI) 27 65 93 59-71  Shift 12 62 89 55-69  Emotional Control 15 59 82 54-64  Emotion Regulation Index (ERI) 27 63 86 58-68  Initiate 19 69 97 62-76  Working Memory 20 74 >99 68-80  Plan/Organize 16 62 88 56-68  Task-Monitor 11 56 77 48-64  Organization of Materials 16 62 88 56-68  Cognitive Regulation Index (CRI) 82 68 94 64-72  Global Executive Composite (GEC) 136 68 94 65-71   Validity scale Raw score Percentile Protocol classification  Inconsistency 6 98 Acceptable  Negativity 1 98 Acceptable  Infrequency 0 98 Acceptable  Note. Age-specific norms have been used to generate these scores. For  additional interpretive information, refer to the [facility] Professional Manual.  Heron RAMAN. [facility], SSP [place] Licensed Psychological Associate [phone] Psychologist [facility] at [facility]   [phone]  Office [phone]  Fax
